# Patient Record
Sex: Female | Born: 1990 | Race: Black or African American | Hispanic: No | Marital: Single | Smoking: Never smoker
Health system: Southern US, Community
[De-identification: ages and names within clinical notes are randomized; demographics above are authoritative.]

## PROBLEM LIST (undated history)

## (undated) DIAGNOSIS — E282 Polycystic ovarian syndrome: Secondary | ICD-10-CM

## (undated) HISTORY — PX: BREAST SURGERY: SHX581

---

## 2015-07-11 ENCOUNTER — Emergency Department (HOSPITAL_BASED_OUTPATIENT_CLINIC_OR_DEPARTMENT_OTHER): Payer: Self-pay

## 2015-07-11 ENCOUNTER — Encounter (HOSPITAL_BASED_OUTPATIENT_CLINIC_OR_DEPARTMENT_OTHER): Payer: Self-pay | Admitting: *Deleted

## 2015-07-11 ENCOUNTER — Emergency Department (HOSPITAL_BASED_OUTPATIENT_CLINIC_OR_DEPARTMENT_OTHER)
Admission: EM | Admit: 2015-07-11 | Discharge: 2015-07-11 | Disposition: A | Discharge status: Home / Self Care | Payer: Self-pay | Attending: Emergency Medicine | Admitting: Emergency Medicine

## 2015-07-11 DIAGNOSIS — Y998 Other external cause status: Secondary | ICD-10-CM | POA: Insufficient documentation

## 2015-07-11 DIAGNOSIS — S4991XA Unspecified injury of right shoulder and upper arm, initial encounter: Secondary | ICD-10-CM | POA: Insufficient documentation

## 2015-07-11 DIAGNOSIS — M542 Cervicalgia: Secondary | ICD-10-CM

## 2015-07-11 DIAGNOSIS — M79603 Pain in arm, unspecified: Secondary | ICD-10-CM

## 2015-07-11 DIAGNOSIS — S4992XA Unspecified injury of left shoulder and upper arm, initial encounter: Secondary | ICD-10-CM | POA: Insufficient documentation

## 2015-07-11 DIAGNOSIS — Z8639 Personal history of other endocrine, nutritional and metabolic disease: Secondary | ICD-10-CM | POA: Insufficient documentation

## 2015-07-11 DIAGNOSIS — Y9289 Other specified places as the place of occurrence of the external cause: Secondary | ICD-10-CM | POA: Insufficient documentation

## 2015-07-11 DIAGNOSIS — Y9389 Activity, other specified: Secondary | ICD-10-CM | POA: Insufficient documentation

## 2015-07-11 DIAGNOSIS — S199XXA Unspecified injury of neck, initial encounter: Secondary | ICD-10-CM | POA: Insufficient documentation

## 2015-07-11 HISTORY — DX: Polycystic ovarian syndrome: E28.2

## 2015-07-11 MED ORDER — METHOCARBAMOL 500 MG PO TABS
500.0000 mg | ORAL_TABLET | Freq: Two times a day (BID) | ORAL | Status: AC
Start: 2015-07-11 — End: ?

## 2015-07-11 MED ORDER — METHOCARBAMOL 500 MG PO TABS
500.0000 mg | ORAL_TABLET | Freq: Once | ORAL | Status: AC
  Administered 2015-07-11: 500 mg via ORAL
  Filled 2015-07-11: qty 1

## 2015-07-11 MED ORDER — ACETAMINOPHEN 325 MG PO TABS
650.0000 mg | ORAL_TABLET | Freq: Once | ORAL | Status: AC
  Administered 2015-07-11: 650 mg via ORAL
  Filled 2015-07-11: qty 2

## 2015-07-11 NOTE — Discharge Instructions (Signed)
Cervical Strain and Sprain With Rehab  Cervical strain and sprain are injuries that commonly occur with "whiplash" injuries. Whiplash occurs when the neck is forcefully whipped backward or forward, such as during a motor vehicle accident or during contact sports. The muscles, ligaments, tendons, discs, and nerves of the neck are susceptible to injury when this occurs.  RISK FACTORS  Risk of having a whiplash injury increases if:  · Osteoarthritis of the spine.  · Situations that make head or neck accidents or trauma more likely.  · High-risk sports (football, rugby, wrestling, hockey, auto racing, gymnastics, diving, contact karate, or boxing).  · Poor strength and flexibility of the neck.  · Previous neck injury.  · Poor tackling technique.  · Improperly fitted or padded equipment.  SYMPTOMS   · Pain or stiffness in the front or back of neck or both.  · Symptoms may present immediately or up to 24 hours after injury.  · Dizziness, headache, nausea, and vomiting.  · Muscle spasm with soreness and stiffness in the neck.  · Tenderness and swelling at the injury site.  PREVENTION  · Learn and use proper technique (avoid tackling with the head, spearing, and head-butting; use proper falling techniques to avoid landing on the head).  · Warm up and stretch properly before activity.  · Maintain physical fitness:    Strength, flexibility, and endurance.    Cardiovascular fitness.  · Wear properly fitted and padded protective equipment, such as padded soft collars, for participation in contact sports.  PROGNOSIS   Recovery from cervical strain and sprain injuries is dependent on the extent of the injury. These injuries are usually curable in 1 week to 3 months with appropriate treatment.   RELATED COMPLICATIONS   · Temporary numbness and weakness may occur if the nerve roots are damaged, and this may persist until the nerve has completely healed.  · Chronic pain due to frequent recurrence of symptoms.  · Prolonged healing,  especially if activity is resumed too soon (before complete recovery).  TREATMENT   Treatment initially involves the use of ice and medication to help reduce pain and inflammation. It is also important to perform strengthening and stretching exercises and modify activities that worsen symptoms so the injury does not get worse. These exercises may be performed at home or with a therapist. For patients who experience severe symptoms, a soft, padded collar may be recommended to be worn around the neck.   Improving your posture may help reduce symptoms. Posture improvement includes pulling your chin and abdomen in while sitting or standing. If you are sitting, sit in a firm chair with your buttocks against the back of the chair. While sleeping, try replacing your pillow with a small towel rolled to 2 inches in diameter, or use a cervical pillow or soft cervical collar. Poor sleeping positions delay healing.   For patients with nerve root damage, which causes numbness or weakness, the use of a cervical traction apparatus may be recommended. Surgery is rarely necessary for these injuries. However, cervical strain and sprains that are present at birth (congenital) may require surgery.  MEDICATION   · If pain medication is necessary, nonsteroidal anti-inflammatory medications, such as aspirin and ibuprofen, or other minor pain relievers, such as acetaminophen, are often recommended.  · Do not take pain medication for 7 days before surgery.  · Prescription pain relievers may be given if deemed necessary by your caregiver. Use only as directed and only as much as you need.    HEAT AND COLD:   · Cold treatment (icing) relieves pain and reduces inflammation. Cold treatment should be applied for 10 to 15 minutes every 2 to 3 hours for inflammation and pain and immediately after any activity that aggravates your symptoms. Use ice packs or an ice massage.  · Heat treatment may be used prior to performing the stretching and  strengthening activities prescribed by your caregiver, physical therapist, or athletic trainer. Use a heat pack or a warm soak.  SEEK MEDICAL CARE IF:   · Symptoms get worse or do not improve in 2 weeks despite treatment.  · New, unexplained symptoms develop (drugs used in treatment may produce side effects).  EXERCISES  RANGE OF MOTION (ROM) AND STRETCHING EXERCISES - Cervical Strain and Sprain  These exercises may help you when beginning to rehabilitate your injury. In order to successfully resolve your symptoms, you must improve your posture. These exercises are designed to help reduce the forward-head and rounded-shoulder posture which contributes to this condition. Your symptoms may resolve with or without further involvement from your physician, physical therapist or athletic trainer. While completing these exercises, remember:   · Restoring tissue flexibility helps normal motion to return to the joints. This allows healthier, less painful movement and activity.  · An effective stretch should be held for at least 20 seconds, although you may need to begin with shorter hold times for comfort.  · A stretch should never be painful. You should only feel a gentle lengthening or release in the stretched tissue.  STRETCH- Axial Extensors  · Lie on your back on the floor. You may bend your knees for comfort. Place a rolled-up hand towel or dish towel, about 2 inches in diameter, under the part of your head that makes contact with the floor.  · Gently tuck your chin, as if trying to make a "double chin," until you feel a gentle stretch at the base of your head.  · Hold __________ seconds.  Repeat __________ times. Complete this exercise __________ times per day.   STRETCH - Axial Extension   · Stand or sit on a firm surface. Assume a good posture: chest up, shoulders drawn back, abdominal muscles slightly tense, knees unlocked (if standing) and feet hip width apart.  · Slowly retract your chin so your head slides back  and your chin slightly lowers. Continue to look straight ahead.  · You should feel a gentle stretch in the back of your head. Be certain not to feel an aggressive stretch since this can cause headaches later.  · Hold for __________ seconds.  Repeat __________ times. Complete this exercise __________ times per day.  STRETCH - Cervical Side Bend   · Stand or sit on a firm surface. Assume a good posture: chest up, shoulders drawn back, abdominal muscles slightly tense, knees unlocked (if standing) and feet hip width apart.  · Without letting your nose or shoulders move, slowly tip your right / left ear to your shoulder until your feel a gentle stretch in the muscles on the opposite side of your neck.  · Hold __________ seconds.  Repeat __________ times. Complete this exercise __________ times per day.  STRETCH - Cervical Rotators   · Stand or sit on a firm surface. Assume a good posture: chest up, shoulders drawn back, abdominal muscles slightly tense, knees unlocked (if standing) and feet hip width apart.  · Keeping your eyes level with the ground, slowly turn your head until you feel a gentle stretch along   the back and opposite side of your neck.  · Hold __________ seconds.  Repeat __________ times. Complete this exercise __________ times per day.  RANGE OF MOTION - Neck Circles   · Stand or sit on a firm surface. Assume a good posture: chest up, shoulders drawn back, abdominal muscles slightly tense, knees unlocked (if standing) and feet hip width apart.  · Gently roll your head down and around from the back of one shoulder to the back of the other. The motion should never be forced or painful.  · Repeat the motion 10-20 times, or until you feel the neck muscles relax and loosen.  Repeat __________ times. Complete the exercise __________ times per day.  STRENGTHENING EXERCISES - Cervical Strain and Sprain  These exercises may help you when beginning to rehabilitate your injury. They may resolve your symptoms with or  without further involvement from your physician, physical therapist, or athletic trainer. While completing these exercises, remember:   · Muscles can gain both the endurance and the strength needed for everyday activities through controlled exercises.  · Complete these exercises as instructed by your physician, physical therapist, or athletic trainer. Progress the resistance and repetitions only as guided.  · You may experience muscle soreness or fatigue, but the pain or discomfort you are trying to eliminate should never worsen during these exercises. If this pain does worsen, stop and make certain you are following the directions exactly. If the pain is still present after adjustments, discontinue the exercise until you can discuss the trouble with your clinician.  STRENGTH - Cervical Flexors, Isometric  · Face a wall, standing about 6 inches away. Place a small pillow, a ball about 6-8 inches in diameter, or a folded towel between your forehead and the wall.  · Slightly tuck your chin and gently push your forehead into the soft object. Push only with mild to moderate intensity, building up tension gradually. Keep your jaw and forehead relaxed.  · Hold 10 to 20 seconds. Keep your breathing relaxed.  · Release the tension slowly. Relax your neck muscles completely before you start the next repetition.  Repeat __________ times. Complete this exercise __________ times per day.  STRENGTH- Cervical Lateral Flexors, Isometric   · Stand about 6 inches away from a wall. Place a small pillow, a ball about 6-8 inches in diameter, or a folded towel between the side of your head and the wall.  · Slightly tuck your chin and gently tilt your head into the soft object. Push only with mild to moderate intensity, building up tension gradually. Keep your jaw and forehead relaxed.  · Hold 10 to 20 seconds. Keep your breathing relaxed.  · Release the tension slowly. Relax your neck muscles completely before you start the next  repetition.  Repeat __________ times. Complete this exercise __________ times per day.  STRENGTH - Cervical Extensors, Isometric   · Stand about 6 inches away from a wall. Place a small pillow, a ball about 6-8 inches in diameter, or a folded towel between the back of your head and the wall.  · Slightly tuck your chin and gently tilt your head back into the soft object. Push only with mild to moderate intensity, building up tension gradually. Keep your jaw and forehead relaxed.  · Hold 10 to 20 seconds. Keep your breathing relaxed.  · Release the tension slowly. Relax your neck muscles completely before you start the next repetition.  Repeat __________ times. Complete this exercise __________ times per day.    POSTURE AND BODY MECHANICS CONSIDERATIONS - Cervical Strain and Sprain  Keeping correct posture when sitting, standing or completing your activities will reduce the stress put on different body tissues, allowing injured tissues a chance to heal and limiting painful experiences. The following are general guidelines for improved posture. Your physician or physical therapist will provide you with any instructions specific to your needs. While reading these guidelines, remember:  · The exercises prescribed by your provider will help you have the flexibility and strength to maintain correct postures.  · The correct posture provides the optimal environment for your joints to work. All of your joints have less wear and tear when properly supported by a spine with good posture. This means you will experience a healthier, less painful body.  · Correct posture must be practiced with all of your activities, especially prolonged sitting and standing. Correct posture is as important when doing repetitive low-stress activities (typing) as it is when doing a single heavy-load activity (lifting).  PROLONGED STANDING WHILE SLIGHTLY LEANING FORWARD  When completing a task that requires you to lean forward while standing in one  place for a long time, place either foot up on a stationary 2- to 4-inch high object to help maintain the best posture. When both feet are on the ground, the low back tends to lose its slight inward curve. If this curve flattens (or becomes too large), then the back and your other joints will experience too much stress, fatigue more quickly, and can cause pain.   RESTING POSITIONS  Consider which positions are most painful for you when choosing a resting position. If you have pain with flexion-based activities (sitting, bending, stooping, squatting), choose a position that allows you to rest in a less flexed posture. You would want to avoid curling into a fetal position on your side. If your pain worsens with extension-based activities (prolonged standing, working overhead), avoid resting in an extended position such as sleeping on your stomach. Most people will find more comfort when they rest with their spine in a more neutral position, neither too rounded nor too arched. Lying on a non-sagging bed on your side with a pillow between your knees, or on your back with a pillow under your knees will often provide some relief. Keep in mind, being in any one position for a prolonged period of time, no matter how correct your posture, can still lead to stiffness.  WALKING  Walk with an upright posture. Your ears, shoulders, and hips should all line up.  OFFICE WORK  When working at a desk, create an environment that supports good, upright posture. Without extra support, muscles fatigue and lead to excessive strain on joints and other tissues.  CHAIR:  · A chair should be able to slide under your desk when your back makes contact with the back of the chair. This allows you to work closely.  · The chair's height should allow your eyes to be level with the upper part of your monitor and your hands to be slightly lower than your elbows.  · Body position:    Your feet should make contact with the floor. If this is not  possible, use a foot rest.    Keep your ears over your shoulders. This will reduce stress on your neck and low back.     This information is not intended to replace advice given to you by your health care provider. Make sure you discuss any questions you have with your health care provider.       Document Released: 03/28/2005 Document Revised: 04/18/2014 Document Reviewed: 07/10/2008  [employer] Interactive Patient Education ©2016 [employer]
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
## 2021-02-05 ENCOUNTER — Emergency Department (HOSPITAL_COMMUNITY)
Admission: EM | Admit: 2021-02-05 | Discharge: 2021-02-09 | Disposition: E | Payer: Self-pay | Attending: Emergency Medicine | Admitting: Emergency Medicine

## 2021-02-05 DIAGNOSIS — S41001A Unspecified open wound of right shoulder, initial encounter: Secondary | ICD-10-CM | POA: Insufficient documentation

## 2021-02-05 DIAGNOSIS — I469 Cardiac arrest, cause unspecified: Secondary | ICD-10-CM | POA: Insufficient documentation

## 2021-02-05 DIAGNOSIS — W3400XA Accidental discharge from unspecified firearms or gun, initial encounter: Secondary | ICD-10-CM | POA: Insufficient documentation
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
## 2021-02-09 NOTE — ED Notes (Addendum)
Pt belongings collected in paper bag for evidence. Belongings given to M. Kathrynn Running and Lacinda Axon.

## 2021-02-09 NOTE — ED Notes (Signed)
Compressions paused.
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
## 2021-02-09 NOTE — Code Documentation (Addendum)
Patient time of death occurred at 51.
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
## 2021-02-09 NOTE — ED Notes (Signed)
Cardiac ultrasound at bedside by Dr. Pilar Plate
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
## 2021-02-09 DEATH — deceased
